# Patient Record
Sex: Male | Born: 1975 | Race: Black or African American | Hispanic: No | Marital: Single
Health system: Southern US, Community
[De-identification: ages and names within clinical notes are randomized; demographics above are authoritative.]

---

## 2020-02-17 ENCOUNTER — Emergency Department (HOSPITAL_COMMUNITY)
Admission: EM | Admit: 2020-02-17 | Discharge: 2020-02-17 | Disposition: A | Discharge status: Home / Self Care | Attending: Emergency Medicine | Admitting: Emergency Medicine

## 2020-02-17 ENCOUNTER — Encounter (HOSPITAL_COMMUNITY): Payer: Self-pay | Admitting: *Deleted

## 2020-02-17 ENCOUNTER — Other Ambulatory Visit: Payer: Self-pay

## 2020-02-17 ENCOUNTER — Emergency Department (HOSPITAL_COMMUNITY)

## 2020-02-17 DIAGNOSIS — W278XXA Contact with other nonpowered hand tool, initial encounter: Secondary | ICD-10-CM | POA: Diagnosis not present

## 2020-02-17 DIAGNOSIS — S99922A Unspecified injury of left foot, initial encounter: Secondary | ICD-10-CM | POA: Insufficient documentation

## 2020-02-17 DIAGNOSIS — Y9389 Activity, other specified: Secondary | ICD-10-CM | POA: Diagnosis not present

## 2020-02-17 MED ORDER — OXYCODONE HCL 5 MG PO TABS
5.0000 mg | ORAL_TABLET | Freq: Once | ORAL | Status: AC
  Administered 2020-02-17: 5 mg via ORAL
  Filled 2020-02-17: qty 1

## 2020-02-17 NOTE — Discharge Instructions (Addendum)
You were evaluated in the Emergency Department and after careful evaluation, we did not find any emergent condition requiring admission or further testing in the hospital.  Your exam/testing today was overall reassuring.  X-ray does not show any broken bones.  Suspect you have a deep bruise which will be painful for several days.  We recommend Tylenol or ibuprofen at home for discomfort and use of the hard sole shoe provided.  Please return to the Emergency Department if you experience any worsening of your condition.  Thank you for allowing [place] to be a part of your care.

## 2020-02-17 NOTE — ED Provider Notes (Signed)
[facility]-EMERGENCY DEPT [facility] Emergency Department Provider Note MRN:  [id number]  Arrival date & time: 02/17/20     Chief Complaint   Toe Pain   History of Present Illness   Rickey Gonzalez is a 44 y.o. year-old male with no previous medical history presenting to the ED with chief complaint of toe pain.  Patient was doing Holiday representative and using a jackhammer and the jackhammer slipped and struck him on the left great toe.  He was wearing steel toed boots but the jackhammer struck him proximal to the steel toe.  He is endorsing isolated pain to the left great toe, no other injuries or complaints.  Pain is severe, constant, worse with motion or palpation.  Review of Systems  A complete 10 system review of systems was obtained and all systems are negative except as noted in the HPI and PMH.   Patient's Health History   History reviewed. No pertinent past medical history.  History reviewed. No pertinent surgical history.  History reviewed. No pertinent family history.  Social History   Socioeconomic History  . Marital status: Single    Spouse name: Not on file  . Number of children: Not on file  . Years of education: Not on file  . Highest education level: Not on file  Occupational History  . Not on file  Tobacco Use  . Smoking status: Not on file  . Smokeless tobacco: Never Used  Substance and Sexual Activity  . Alcohol use: Not on file  . Drug use: Not on file  . Sexual activity: Not on file  Other Topics Concern  . Not on file  Social History Narrative  . Not on file   Social Determinants of Health   Financial Resource Strain:   . Difficulty of Paying Living Expenses: Not on file  Food Insecurity:   . Worried About Programme researcher, broadcasting/film/video in the Last Year: Not on file  . Ran Out of Food in the Last Year: Not on file  Transportation Needs:   . Lack of Transportation (Medical): Not on file  . Lack of Transportation (Non-Medical): Not on file  Physical Activity:   .  Days of Exercise per Week: Not on file  . Minutes of Exercise per Session: Not on file  Stress:   . Feeling of Stress : Not on file  Social Connections:   . Frequency of Communication with Friends and Family: Not on file  . Frequency of Social Gatherings with Friends and Family: Not on file  . Attends Religious Services: Not on file  . Active Member of Clubs or Organizations: Not on file  . Attends Banker Meetings: Not on file  . Marital Status: Not on file  Intimate Partner Violence:   . Fear of Current or Ex-Partner: Not on file  . Emotionally Abused: Not on file  . Physically Abused: Not on file  . Sexually Abused: Not on file     Physical Exam   Vitals:   02/17/20 1235  BP: (!) 147/97  Pulse: 89  Resp: 18  Temp: 98 F (36.7 C)  SpO2: 91%    CONSTITUTIONAL: Well-appearing, NAD NEURO:  Alert and oriented x 3, no focal deficits EYES:  eyes equal and reactive ENT/NECK:  no LAD, no JVD CARDIO: Regular rate, well-perfused, normal S1 and S2 PULM:  CTAB no wheezing or rhonchi GI/GU:  normal bowel sounds, non-distended, non-tender MSK/SPINE: Tenderness to palpation to the left great toe with overlying abrasion at the IP  joint dorsally SKIN:   PSYCH:  Appropriate speech and behavior  *Additional and/or pertinent findings included in MDM below  Diagnostic and Interventional Summary    EKG Interpretation  Date/Time:    Ventricular Rate:    PR Interval:    QRS Duration:   QT Interval:    QTC Calculation:   R Axis:     Text Interpretation:        Labs Reviewed - No data to display  DG Toe Great Left  Final Result      Medications  oxyCODONE (Oxy IR/ROXICODONE) immediate release tablet 5 mg (5 mg Oral Given 02/17/20 1326)     Procedures  /  Critical Care Procedures  ED Course and Medical Decision Making  I have reviewed the triage vital signs, the nursing notes, and pertinent available records from the EMR.  Listed above are laboratory and  imaging tests that I personally ordered, reviewed, and interpreted and then considered in my medical decision making (see below for details).  X-ray to evaluate for fracture, no other injuries.     X-ray is negative, patient is appropriate for discharge.  Elmer Sow. Pilar Plate, MD [facility] Emergency Medicine [facility] [email or web address]  Final Clinical Impressions(s) / ED Diagnoses     ICD-10-CM   1. Injury of toe on left foot, initial encounter  [phone]     ED Discharge Orders    None       Discharge Instructions Discussed with and Provided to Patient:     Discharge Instructions     You were evaluated in the Emergency Department and after careful evaluation, we did not find any emergent condition requiring admission or further testing in the hospital.  Your exam/testing today was overall reassuring.  X-ray does not show any broken bones.  Suspect you have a deep bruise which will be painful for several days.  We recommend Tylenol or ibuprofen at home for discomfort and use of the hard sole shoe provided.  Please return to the Emergency Department if you experience any worsening of your condition.  Thank you for allowing [place] to be a part of your care.        Sabas Sous, MD 02/17/20 1406

## 2020-02-17 NOTE — ED Triage Notes (Signed)
Pt with left great toe pain after a jackhammer hit his toe, it struck just behind the steel part of his steel toe boot.

## 2021-04-26 IMAGING — DX DG TOE GREAT 2+V*L*
3 series · 3 of 3 positions shown · non-contrast
Comparison: None.

CLINICAL DATA: Left great toe pain after injury

EXAM:
LEFT GREAT TOE

[toe ap]
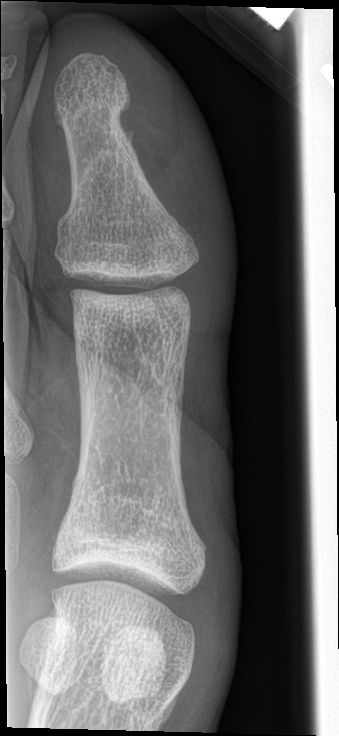

[toe obl]
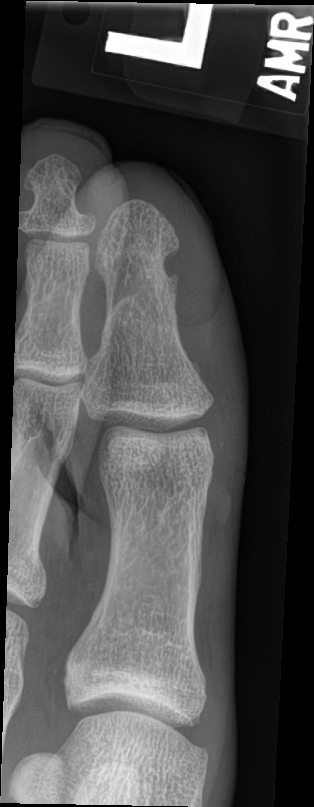

[toe lat]
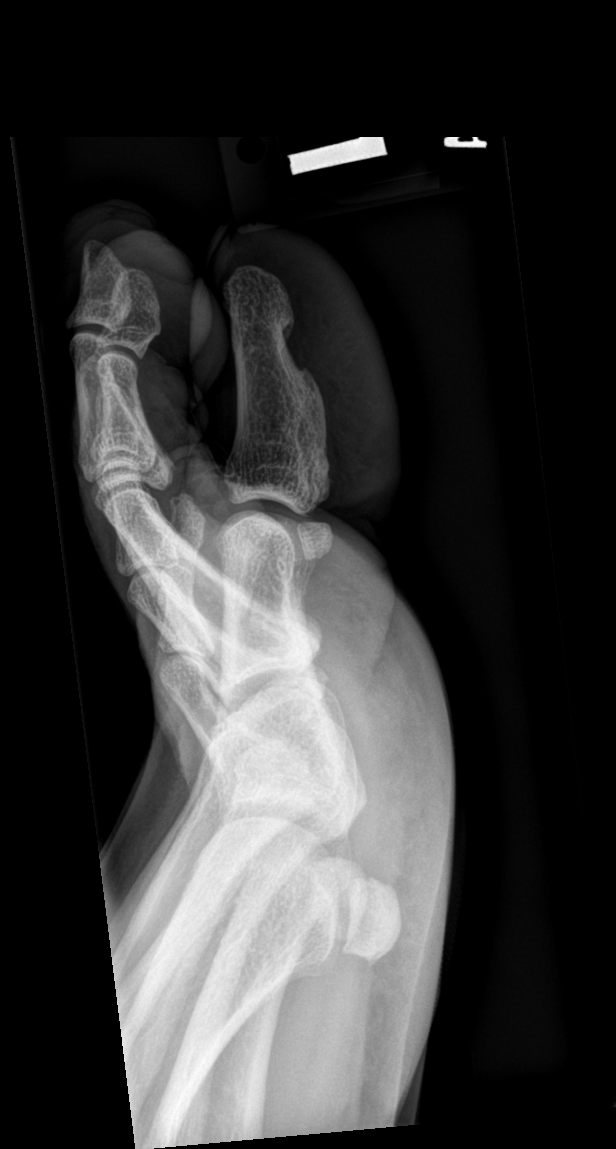

[3 of 3 positions shown; findings below may reference images not displayed]

FINDINGS: There is no evidence of fracture or dislocation. There is no
evidence of arthropathy or other focal bone abnormality. Soft
tissues are unremarkable.
IMPRESSION: Negative.

## 2022-10-19 DIAGNOSIS — Z13220 Encounter for screening for lipoid disorders: Secondary | ICD-10-CM | POA: Diagnosis not present

## 2022-10-19 DIAGNOSIS — Z0000 Encounter for general adult medical examination without abnormal findings: Secondary | ICD-10-CM | POA: Diagnosis not present

## 2022-10-19 DIAGNOSIS — Z125 Encounter for screening for malignant neoplasm of prostate: Secondary | ICD-10-CM | POA: Diagnosis not present

## 2023-03-27 DIAGNOSIS — R309 Painful micturition, unspecified: Secondary | ICD-10-CM | POA: Diagnosis not present

## 2023-03-27 DIAGNOSIS — Z113 Encounter for screening for infections with a predominantly sexual mode of transmission: Secondary | ICD-10-CM | POA: Diagnosis not present

## 2023-03-27 DIAGNOSIS — Z7251 High risk heterosexual behavior: Secondary | ICD-10-CM | POA: Diagnosis not present
